# Patient Record
Sex: Male | Born: 1985 | Race: White | Hispanic: No | Marital: Single | Smoking: Current every day smoker
Health system: Southern US, Community
[De-identification: ages and names within clinical notes are randomized; demographics above are authoritative.]

---
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
## 2007-03-01 ENCOUNTER — Emergency Department (HOSPITAL_COMMUNITY): Admission: EM | Admit: 2007-03-01 | Discharge: 2007-03-01 | Payer: Self-pay | Admitting: Emergency Medicine

## 2009-10-18 ENCOUNTER — Emergency Department (HOSPITAL_COMMUNITY): Admission: EM | Admit: 2009-10-18 | Discharge: 2009-10-18 | Payer: Self-pay | Admitting: Family Medicine

## 2009-10-18 ENCOUNTER — Ambulatory Visit (HOSPITAL_COMMUNITY): Admission: RE | Admit: 2009-10-18 | Discharge: 2009-10-18 | Payer: Self-pay | Admitting: Family Medicine

## 2011-03-11 LAB — DIFFERENTIAL
Basophils Absolute: 0.1
Eosinophils Absolute: 0.3
Eosinophils Relative: 3
Lymphocytes Relative: 22
Lymphs Abs: 2.3
Monocytes Absolute: 0.6
Neutro Abs: 6.8

## 2011-03-11 LAB — POCT I-STAT CREATININE
Creatinine, Ser: 1.1
Operator id: 285841

## 2011-03-11 LAB — I-STAT 8, (EC8 V) (CONVERTED LAB)
Acid-Base Excess: 1
Glucose, Bld: 96
TCO2: 29
pCO2, Ven: 49.4
pH, Ven: 7.355 — ABNORMAL HIGH

## 2011-03-11 LAB — CBC
Hemoglobin: 15.1
MCHC: 34.1
MCV: 90.2
RBC: 4.91
WBC: 10.1

## 2011-03-11 LAB — POCT CARDIAC MARKERS
CKMB, poc: 1.4
Myoglobin, poc: 59.9

## 2012-10-17 ENCOUNTER — Emergency Department (INDEPENDENT_AMBULATORY_CARE_PROVIDER_SITE_OTHER)
Admission: EM | Admit: 2012-10-17 | Discharge: 2012-10-17 | Disposition: A | Discharge status: Home / Self Care | Payer: Self-pay | Source: Home / Self Care | Attending: Family Medicine | Admitting: Family Medicine

## 2012-10-17 ENCOUNTER — Encounter (HOSPITAL_COMMUNITY): Payer: Self-pay | Admitting: *Deleted

## 2012-10-17 DIAGNOSIS — S40919A Unspecified superficial injury of unspecified shoulder, initial encounter: Secondary | ICD-10-CM

## 2012-10-17 MED ORDER — DICLOFENAC POTASSIUM 50 MG PO TABS: 50.0000 mg | ORAL_TABLET | Freq: Three times a day (TID) | ORAL | Status: DC
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
## 2013-05-03 ENCOUNTER — Emergency Department (HOSPITAL_COMMUNITY): Payer: Self-pay

## 2013-05-03 ENCOUNTER — Encounter (HOSPITAL_COMMUNITY): Payer: Self-pay | Admitting: Emergency Medicine

## 2013-05-03 ENCOUNTER — Emergency Department (HOSPITAL_COMMUNITY)
Admission: EM | Admit: 2013-05-03 | Discharge: 2013-05-03 | Disposition: A | Discharge status: Home / Self Care | Payer: Self-pay | Attending: Emergency Medicine | Admitting: Emergency Medicine

## 2013-05-03 DIAGNOSIS — IMO0002 Reserved for concepts with insufficient information to code with codable children: Secondary | ICD-10-CM | POA: Insufficient documentation

## 2013-05-03 DIAGNOSIS — S62173A Displaced fracture of trapezium [larger multangular], unspecified wrist, initial encounter for closed fracture: Secondary | ICD-10-CM | POA: Insufficient documentation

## 2013-05-03 DIAGNOSIS — Y990 Civilian activity done for income or pay: Secondary | ICD-10-CM | POA: Insufficient documentation

## 2013-05-03 DIAGNOSIS — W230XXA Caught, crushed, jammed, or pinched between moving objects, initial encounter: Secondary | ICD-10-CM | POA: Insufficient documentation

## 2013-05-03 DIAGNOSIS — S62171A Displaced fracture of trapezium [larger multangular], right wrist, initial encounter for closed fracture: Secondary | ICD-10-CM

## 2013-05-03 DIAGNOSIS — Y9389 Activity, other specified: Secondary | ICD-10-CM | POA: Insufficient documentation

## 2013-05-03 DIAGNOSIS — Y9269 Other specified industrial and construction area as the place of occurrence of the external cause: Secondary | ICD-10-CM | POA: Insufficient documentation

## 2013-05-03 DIAGNOSIS — S60511A Abrasion of right hand, initial encounter: Secondary | ICD-10-CM

## 2013-05-03 DIAGNOSIS — F17200 Nicotine dependence, unspecified, uncomplicated: Secondary | ICD-10-CM | POA: Insufficient documentation

## 2013-05-03 MED ORDER — OXYCODONE-ACETAMINOPHEN 5-325 MG PO TABS
1.0000 | ORAL_TABLET | Freq: Once | ORAL | Status: AC
  Administered 2013-05-03: 1 via ORAL
  Filled 2013-05-03: qty 1

## 2013-05-03 MED ORDER — OXYCODONE-ACETAMINOPHEN 5-325 MG PO TABS: 1.0000 | ORAL_TABLET | ORAL | Status: DC | PRN

## 2013-05-03 NOTE — Progress Notes (Signed)
Orthopedic Tech Progress Note Patient Details:  Jeff Maldonado 03/04/1986 [id number]  Ortho Devices Type of Ortho Device: Thumb spica splint   [name] 05/03/2013, 5:45 AM

## 2013-05-03 NOTE — ED Notes (Signed)
Clicked off on order by mistake- ortho tech needed new order to "click".  Unable to "unclick".

## 2013-05-03 NOTE — ED Notes (Signed)
Still waiting for ortho tech.

## 2013-05-03 NOTE — ED Notes (Signed)
Ortho tech at bedside to complete task.

## 2013-05-03 NOTE — ED Notes (Signed)
Patient states he hurt his hand/wrist on a building door.  Unclear if door shut on or if patient hit the door.   Patient complains of pain in wrist and hand.

## 2013-05-03 NOTE — Progress Notes (Signed)
Orthopedic Tech Progress Note Patient Details:  Jeff Maldonado 06/26/1985 [id number]  Ortho Devices Type of Ortho Device: Thumb spica splint   [name] M 05/03/2013, 5:45 AM  
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
## 2013-11-19 ENCOUNTER — Emergency Department (HOSPITAL_COMMUNITY)
Admission: EM | Admit: 2013-11-19 | Discharge: 2013-11-19 | Disposition: A | Discharge status: Home / Self Care | Payer: 59 | Attending: Emergency Medicine | Admitting: Emergency Medicine

## 2013-11-19 ENCOUNTER — Encounter (HOSPITAL_COMMUNITY): Payer: Self-pay | Admitting: Emergency Medicine

## 2013-11-19 DIAGNOSIS — M545 Low back pain, unspecified: Secondary | ICD-10-CM | POA: Insufficient documentation

## 2013-11-19 DIAGNOSIS — Z202 Contact with and (suspected) exposure to infections with a predominantly sexual mode of transmission: Secondary | ICD-10-CM | POA: Insufficient documentation

## 2013-11-19 DIAGNOSIS — G4700 Insomnia, unspecified: Secondary | ICD-10-CM | POA: Insufficient documentation

## 2013-11-19 DIAGNOSIS — A088 Other specified intestinal infections: Secondary | ICD-10-CM | POA: Insufficient documentation

## 2013-11-19 DIAGNOSIS — F17200 Nicotine dependence, unspecified, uncomplicated: Secondary | ICD-10-CM | POA: Insufficient documentation

## 2013-11-19 DIAGNOSIS — A084 Viral intestinal infection, unspecified: Secondary | ICD-10-CM

## 2013-11-19 LAB — COMPREHENSIVE METABOLIC PANEL
ALBUMIN: 4.1 g/dL (ref 3.5–5.2)
ALT: 13 U/L (ref 0–53)
AST: 15 U/L (ref 0–37)
Alkaline Phosphatase: 51 U/L (ref 39–117)
BUN: 10 mg/dL (ref 6–23)
CO2: 27 mEq/L (ref 19–32)
CREATININE: 0.91 mg/dL (ref 0.50–1.35)
Calcium: 9.3 mg/dL (ref 8.4–10.5)
Chloride: 101 mEq/L (ref 96–112)
GFR calc Af Amer: >90 mL/min (ref >90)
GFR calc non Af Amer: >90 mL/min (ref >90)
Glucose, Bld: 147 mg/dL — ABNORMAL HIGH (ref 70–99)
POTASSIUM: 3.7 meq/L (ref 3.7–5.3)
Sodium: 142 mEq/L (ref 137–147)
TOTAL PROTEIN: 7.2 g/dL (ref 6.0–8.3)
Total Bilirubin: 0.7 mg/dL (ref 0.3–1.2)

## 2013-11-19 LAB — URINALYSIS, ROUTINE W REFLEX MICROSCOPIC
Bilirubin Urine: NEGATIVE
GLUCOSE, UA: 100 mg/dL — AB
Hgb urine dipstick: NEGATIVE
Ketones, ur: NEGATIVE mg/dL
LEUKOCYTES UA: NEGATIVE
Nitrite: NEGATIVE
PH: 6 (ref 5.0–8.0)
Protein, ur: NEGATIVE mg/dL
Specific Gravity, Urine: 1.028 (ref 1.005–1.030)
Urobilinogen, UA: 0.2 mg/dL (ref 0.0–1.0)

## 2013-11-19 LAB — CBC WITH DIFFERENTIAL/PLATELET
BASOS PCT: 1 % (ref 0–1)
Basophils Absolute: 0.1 10*3/uL (ref 0.0–0.1)
EOS ABS: 0.2 10*3/uL (ref 0.0–0.7)
Eosinophils Relative: 2 % (ref 0–5)
HCT: 39.9 % (ref 39.0–52.0)
HEMOGLOBIN: 13.9 g/dL (ref 13.0–17.0)
Lymphocytes Relative: 22 % (ref 12–46)
Lymphs Abs: 2.4 10*3/uL (ref 0.7–4.0)
MCH: 31 pg (ref 26.0–34.0)
MCHC: 34.8 g/dL (ref 30.0–36.0)
MCV: 88.9 fL (ref 78.0–100.0)
MONO ABS: 0.8 10*3/uL (ref 0.1–1.0)
MONOS PCT: 7 % (ref 3–12)
NEUTROS PCT: 68 % (ref 43–77)
Neutro Abs: 7.2 10*3/uL (ref 1.7–7.7)
Platelets: 272 10*3/uL (ref 150–400)
RBC: 4.49 MIL/uL (ref 4.22–5.81)
RDW: 12 % (ref 11.5–15.5)
WBC: 10.6 10*3/uL — ABNORMAL HIGH (ref 4.0–10.5)

## 2013-11-19 MED ORDER — AZITHROMYCIN 1 G PO PACK
1.0000 g | PACK | Freq: Once | ORAL | Status: AC
  Administered 2013-11-19: 1 g via ORAL
  Filled 2013-11-19: qty 1

## 2013-11-19 MED ORDER — CEFTRIAXONE SODIUM 250 MG IJ SOLR
250.0000 mg | Freq: Once | INTRAMUSCULAR | Status: AC
  Administered 2013-11-19: 250 mg via INTRAMUSCULAR
  Filled 2013-11-19: qty 250

## 2013-11-19 MED ORDER — METRONIDAZOLE 500 MG PO TABS
2000.0000 mg | ORAL_TABLET | Freq: Once | ORAL | Status: AC
  Administered 2013-11-19: 2000 mg via ORAL
  Filled 2013-11-19: qty 4

## 2013-11-19 MED ORDER — ONDANSETRON HCL 4 MG PO TABS: 4.0000 mg | ORAL_TABLET | Freq: Four times a day (QID) | ORAL | Status: DC

## 2013-11-19 NOTE — ED Notes (Signed)
Pt getting a urine specimen at this time. States he has been having chills for the past week and just feels weak all over. States no foul odor with urine only with bowels.

## 2013-11-19 NOTE — ED Provider Notes (Signed)
CSN: [id number]     Arrival date & time 11/19/13  1206 History   First MD Initiated Contact with Patient 11/19/13 1709     Chief Complaint  Patient presents with  . Weakness  . Emesis     (Consider location/radiation/quality/duration/timing/severity/associated sxs/prior Treatment) HPI Comments: Pt presents w/ about 1 week of n/v/d, intermittent, sharp, BL low back pains, chills, generalized weakness and insomnia. Pt reports his sexual partner was recently treated for trich. He denies urinary frequency, dysuria, hematuria, penile d/c, rectal pain. No back pain currently.  Patient is a 28 y.o. male presenting with weakness and vomiting. The history is provided by the patient. No language interpreter was used.  Weakness This is a new problem. The current episode started more than 2 days ago (1 week). The problem occurs constantly. The problem has been gradually worsening. Pertinent negatives include no chest pain, no abdominal pain, no headaches and no shortness of breath. Nothing aggravates the symptoms. Nothing relieves the symptoms. He has tried nothing for the symptoms. The treatment provided no relief.  Emesis Severity:  Moderate Duration:  1 week Timing:  Intermittent Number of daily episodes:  2-5 Quality:  Stomach contents Progression:  Unchanged Chronicity:  New Recent urination:  Normal Relieved by:  Nothing Worsened by:  Nothing tried Ineffective treatments:  None tried Associated symptoms: chills and diarrhea   Associated symptoms: no abdominal pain, no cough and no headaches   Diarrhea:    Quality:  Watery and semi-solid   Number of occurrences:  3-5 daily   Severity:  Moderate   Duration:  1 month   Timing:  Intermittent   Progression:  Unchanged Risk factors: no sick contacts, no suspect food intake and no travel to endemic areas     History reviewed. No pertinent past medical history. History reviewed. No pertinent past surgical history. Family History  Problem  Relation Age of Onset  . Diabetes Maternal Aunt    History  Substance Use Topics  . Smoking status: Current Every Day Smoker -- 1.00 packs/day    Types: Cigarettes  . Smokeless tobacco: Not on file  . Alcohol Use: No    Review of Systems  Constitutional: Positive for chills. Negative for fever, activity change, appetite change and fatigue.  HENT: Negative for congestion, facial swelling, rhinorrhea and trouble swallowing.   Eyes: Negative for photophobia and pain.  Respiratory: Negative for cough, chest tightness and shortness of breath.   Cardiovascular: Negative for chest pain and leg swelling.  Gastrointestinal: Positive for vomiting and diarrhea. Negative for nausea, abdominal pain and constipation.  Endocrine: Negative for polydipsia and polyuria.  Genitourinary: Negative for dysuria, urgency, decreased urine volume and difficulty urinating.  Musculoskeletal: Negative for back pain and gait problem.  Skin: Negative for color change, rash and wound.  Allergic/Immunologic: Negative for immunocompromised state.  Neurological: Positive for weakness. Negative for dizziness, facial asymmetry, speech difficulty, numbness and headaches.  Psychiatric/Behavioral: Negative for confusion, decreased concentration and agitation.      Allergies  Review of patient's allergies indicates no known allergies.  Home Medications   Prior to Admission medications   Medication Sig Start Date End Date Taking? Authorizing Provider  Aspirin-Acetaminophen-Caffeine (GOODY HEADACHE PO) Take 2 packets by mouth daily as needed (pain).   Yes Historical Provider, MD  ondansetron (ZOFRAN) 4 MG tablet Take 1 tablet (4 mg total) by mouth every 6 (six) hours. 11/19/13   Shanna CiscoMegan E Docherty, MD   BP 120/73  Pulse 58  Temp(Src) 98.9 F (37.2  C) (Oral)  Resp 13  SpO2 98% Physical Exam  Constitutional: He is oriented to person, place, and time. He appears well-developed and well-nourished. No distress.  HENT:   Head: Normocephalic and atraumatic.  Mouth/Throat: No oropharyngeal exudate.  Eyes: Pupils are equal, round, and reactive to light.  Neck: Normal range of motion. Neck supple.  Cardiovascular: Normal rate, regular rhythm and normal heart sounds.  Exam reveals no gallop and no friction rub.   No murmur heard. Pulmonary/Chest: Effort normal and breath sounds normal. No respiratory distress. He has no wheezes. He has no rales.  Abdominal: Soft. Bowel sounds are normal. He exhibits no distension and no mass. There is no tenderness. There is no rebound and no guarding.  Genitourinary: Penis normal.  Musculoskeletal: Normal range of motion. He exhibits no edema and no tenderness.  Neurological: He is alert and oriented to person, place, and time.  Skin: Skin is warm and dry.  Psychiatric: He has a normal mood and affect.    ED Course  Procedures (including critical care time) Labs Review Labs Reviewed  CBC WITH DIFFERENTIAL - Abnormal; Notable for the following:    WBC 10.6 (*)    All other components within normal limits  COMPREHENSIVE METABOLIC PANEL - Abnormal; Notable for the following:    Glucose, Bld 147 (*)    All other components within normal limits  URINALYSIS, ROUTINE W REFLEX MICROSCOPIC - Abnormal; Notable for the following:    Glucose, UA 100 (*)    All other components within normal limits  GC/CHLAMYDIA PROBE AMP  RPR    Imaging Review No results found.   EKG Interpretation None      MDM   Final diagnoses:  Viral gastroenteritis  Exposure to STD    Pt is a 28 y.o. male with Pmhx as above who presents with about 1 week of n/v/d, intermittent, sharp, BL low back pains, chills, and insomnia. Pt reports his sexual partner was recently treated for trich. He denies urinary frequency, dysuria, hematuria, penile d/c, rectal pain. No back pain currently. On PE, VSS, pt in NAD. He has mild LUQ ab pain on PE w/o rebound or guarding. GU exam benign. No d/c. WBC mildly  elevated. CMP noncontributory, UA nml. GC/Chlam, RPR sent. Pt declined. HIV.  Suspect viral gastroenteritis. Will treat for GC/CHlam/trich exposure. Return precautions given for new or worsening symptoms including worsening pain, fever, inability to tolerate PO.          Shanna CiscoMegan E Docherty, MD 11/19/13 [phone]

## 2013-11-19 NOTE — ED Notes (Signed)
Pt. Stated, I need to talk to a doctor.  I've been having weakness with vomiting for about a week now.  Me and my wife got back together a  month ago and then she found out she has Angolarich.. So Im not sure .  I know I have this foul smell in my bowels and my nerves are really bad.

## 2013-11-19 NOTE — ED Notes (Signed)
Inspected right arm, no swelling, redness noted.

## 2013-11-19 NOTE — Discharge Instructions (Signed)
Viral Gastroenteritis °Viral gastroenteritis is also known as stomach flu. This condition affects the stomach and intestinal tract. It can cause sudden diarrhea and vomiting. The illness typically lasts 3 to 8 days. Most people develop an immune response that eventually gets rid of the virus. While this natural response develops, the virus can make you quite ill. °CAUSES  °Many different viruses can cause gastroenteritis, such as rotavirus or noroviruses. You can catch one of these viruses by consuming contaminated food or water. You may also catch a virus by sharing utensils or other personal items with an infected person or by touching a contaminated surface. °SYMPTOMS  °The most common symptoms are diarrhea and vomiting. These problems can cause a severe loss of body fluids (dehydration) and a body salt (electrolyte) imbalance. Other symptoms may include: °· Fever. °· Headache. °· Fatigue. °· Abdominal pain. °DIAGNOSIS  °Your caregiver can usually diagnose viral gastroenteritis based on your symptoms and a physical exam. A stool sample may also be taken to test for the presence of viruses or other infections. °TREATMENT  °This illness typically goes away on its own. Treatments are aimed at rehydration. The most serious cases of viral gastroenteritis involve vomiting so severely that you are not able to keep fluids down. In these cases, fluids must be given through an intravenous line (IV). °HOME CARE INSTRUCTIONS  °· Drink enough fluids to keep your urine clear or pale yellow. Drink small amounts of fluids frequently and increase the amounts as tolerated. °· Ask your caregiver for specific rehydration instructions. °· Avoid: °¨ Foods high in sugar. °¨ Alcohol. °¨ Carbonated drinks. °¨ Tobacco. °¨ Juice. °¨ Caffeine drinks. °¨ Extremely hot or cold fluids. °¨ Fatty, greasy foods. °¨ Too much intake of anything at one time. °¨ Dairy products until 24 to 48 hours after diarrhea stops. °· You may consume probiotics.  Probiotics are active cultures of beneficial bacteria. They may lessen the amount and number of diarrheal stools in adults. Probiotics can be found in yogurt with active cultures and in supplements. °· Wash your hands well to avoid spreading the virus. °· Only take over-the-counter or prescription medicines for pain, discomfort, or fever as directed by your caregiver. Do not give aspirin to children. Antidiarrheal medicines are not recommended. °· Ask your caregiver if you should continue to take your regular prescribed and over-the-counter medicines. °· Keep all follow-up appointments as directed by your caregiver. °SEEK IMMEDIATE MEDICAL CARE IF:  °· You are unable to keep fluids down. °· You do not urinate at least once every 6 to 8 hours. °· You develop shortness of breath. °· You notice blood in your stool or vomit. This may look like coffee grounds. °· You have abdominal pain that increases or is concentrated in one small area (localized). °· You have persistent vomiting or diarrhea. °· You have a fever. °· The patient is a child younger than 3 months, and he or she has a fever. °· The patient is a child older than 3 months, and he or she has a fever and persistent symptoms. °· The patient is a child older than 3 months, and he or she has a fever and symptoms suddenly get worse. °· The patient is a baby, and he or she has no tears when crying. °MAKE SURE YOU:  °· Understand these instructions. °· Will watch your condition. °· Will get help right away if you are not doing well or get worse. °Document Released: 05/17/2005 Document Revised: 08/09/2011 Document Reviewed: 03/03/2011 °  ExitCare Patient Information 2015 [place], [place]. This information is not intended to replace advice given to you by your health care provider. Make sure you discuss any questions you have with your health care provider.  Sexually Transmitted Disease A sexually transmitted disease (STD) is a disease or infection often passed to  another person during sex. However, STDs can be passed through nonsexual ways. An STD can be passed through:  Spit (saliva).  Semen.  Blood.  Mucus from the vagina.  Pee (urine). HOW CAN I LESSEN MY CHANCES OF GETTING AN STD?  Use:  Latex condoms.  Water-soluble lubricants with condoms. Do not use petroleum jelly or oils.  Dental dams. These are small pieces of latex that are used as a barrier during oral sex.  Avoid having more than one sex partner.  Do not have sex with someone who has other sex partners.  Do not have sex with anyone you do not know or who is at high risk for an STD.  Avoid risky sex that can break your skin.  Do not have sex if you have open sores on your mouth or skin.  Avoid drinking too much alcohol or taking illegal drugs. Alcohol and drugs can affect your good judgment.  Avoid oral and anal sex acts.  Get shots (vaccines) for HPV and hepatitis.  If you are at risk of being infected with HIV, it is advised that you take a certain medicine daily to prevent HIV infection. This is called pre-exposure prophylaxis (PrEP). You may be at risk if:  You are a man who has sex with other men (MSM).  You are attracted to the opposite sex (heterosexual) and are having sex with more than one partner.  You take drugs with a needle.  You have sex with someone who has HIV.  Talk with your doctor about if you are at high risk of being infected with HIV. If you begin to take PrEP, get tested for HIV first. Get tested every 3 months for as long as you are taking PrEP. WHAT SHOULD I DO IF I THINK I HAVE AN STD?  See your doctor.  Tell your sex partner(s) that you have an STD. They should be tested and treated.  Do not have sex until your doctor says it is okay. WHEN SHOULD I GET HELP? Get help right away if:  You have bad belly (abdominal) pain.  You are a man and have puffiness (swelling) or pain in your testicles.  You are a woman and have puffiness  in your vagina. Document Released: 06/24/2004 Document Revised: 05/22/2013 Document Reviewed: 11/10/2012 [facility] Patient Information 2015 [place], [place]. This information is not intended to replace advice given to you by your health care provider. Make sure you discuss any questions you have with your health care provider.

## 2013-11-20 LAB — GC/CHLAMYDIA PROBE AMP
CT Probe RNA: NEGATIVE
GC PROBE AMP APTIMA: NEGATIVE

## 2013-11-20 LAB — RPR

## 2016-07-08 ENCOUNTER — Encounter (HOSPITAL_COMMUNITY): Payer: Self-pay

## 2016-07-08 DIAGNOSIS — Z7982 Long term (current) use of aspirin: Secondary | ICD-10-CM | POA: Insufficient documentation

## 2016-07-08 DIAGNOSIS — F17210 Nicotine dependence, cigarettes, uncomplicated: Secondary | ICD-10-CM | POA: Insufficient documentation

## 2016-07-08 DIAGNOSIS — Z79899 Other long term (current) drug therapy: Secondary | ICD-10-CM | POA: Insufficient documentation

## 2016-07-08 DIAGNOSIS — F1910 Other psychoactive substance abuse, uncomplicated: Secondary | ICD-10-CM | POA: Insufficient documentation

## 2016-07-08 LAB — CBC
HEMATOCRIT: 37.1 % — AB (ref 39.0–52.0)
HEMOGLOBIN: 12.7 g/dL — AB (ref 13.0–17.0)
MCH: 30.4 pg (ref 26.0–34.0)
MCHC: 34.2 g/dL (ref 30.0–36.0)
MCV: 88.8 fL (ref 78.0–100.0)
Platelets: 313 10*3/uL (ref 150–400)
RBC: 4.18 MIL/uL — ABNORMAL LOW (ref 4.22–5.81)
RDW: 12.7 % (ref 11.5–15.5)
WBC: 9.8 10*3/uL (ref 4.0–10.5)

## 2016-07-08 LAB — COMPREHENSIVE METABOLIC PANEL
ALT: 26 U/L (ref 17–63)
AST: 31 U/L (ref 15–41)
Albumin: 3.9 g/dL (ref 3.5–5.0)
Alkaline Phosphatase: 59 U/L (ref 38–126)
Anion gap: 8 (ref 5–15)
BILIRUBIN TOTAL: 0.7 mg/dL (ref 0.3–1.2)
BUN: 11 mg/dL (ref 6–20)
CHLORIDE: 106 mmol/L (ref 101–111)
CO2: 26 mmol/L (ref 22–32)
CREATININE: 1.05 mg/dL (ref 0.61–1.24)
Calcium: 9.1 mg/dL (ref 8.9–10.3)
Glucose, Bld: 109 mg/dL — ABNORMAL HIGH (ref 65–99)
POTASSIUM: 3.7 mmol/L (ref 3.5–5.1)
Sodium: 140 mmol/L (ref 135–145)
Total Protein: 6.4 g/dL — ABNORMAL LOW (ref 6.5–8.1)

## 2016-07-08 LAB — RAPID URINE DRUG SCREEN, HOSP PERFORMED
Amphetamines: POSITIVE — AB
BARBITURATES: NOT DETECTED
BENZODIAZEPINES: NOT DETECTED
COCAINE: NOT DETECTED
OPIATES: POSITIVE — AB
Tetrahydrocannabinol: NOT DETECTED

## 2016-07-08 LAB — ETHANOL

## 2016-07-08 NOTE — ED Triage Notes (Signed)
Pt brought in by [employer]. Pt IVC'd at triage. Per IVC pt on amphetamines, hallucinating. Pt denies any hallucinations. Pt denies any drugs/ETOH tonight.

## 2016-07-09 ENCOUNTER — Emergency Department (HOSPITAL_COMMUNITY)
Admission: EM | Admit: 2016-07-09 | Discharge: 2016-07-09 | Disposition: A | Discharge status: Home / Self Care | Payer: Self-pay | Attending: Emergency Medicine | Admitting: Emergency Medicine

## 2016-07-09 DIAGNOSIS — F1910 Other psychoactive substance abuse, uncomplicated: Secondary | ICD-10-CM

## 2016-07-09 NOTE — ED Notes (Signed)
TTS outside door

## 2016-07-09 NOTE — ED Notes (Signed)
Step mother:  Jeff Maldonado requesting to speak with counselor after pt's interview. [phone].

## 2016-07-09 NOTE — ED Notes (Signed)
Mal[place]urkey sandwich and Coke given.

## 2016-07-09 NOTE — ED Notes (Signed)
Lunch order placed

## 2016-07-09 NOTE — ED Notes (Addendum)
TTS machine not working, Financial traderassessors are aware. Assessor will come here to assess.

## 2016-07-09 NOTE — ED Provider Notes (Signed)
Patient signed out to me at 0730 awaiting tts. 11:01 AM Awaiting tts report 12:05 PM Awaiting tts assessment Discussed with Dr. Adela LankFloyd and signed out to him pending tts   Margarita Grizzleanielle Porche Steinberger, MD 07/09/16 [phone]

## 2016-07-09 NOTE — ED Notes (Signed)
Lunch meal given. 

## 2016-07-09 NOTE — ED Notes (Addendum)
Upon return of belongings pt reports he is missing a black wallet on a chain. No documentation or inventory of wallet found. No wallet present in security office. [place] [facility] [place] [facility] [place] spoke to BlueLinxfficer Chandler who brought pt to the ED last night. Per Officer Ave Filterhandler, upon searching the pt last night no wallet was found on pt's person. Family and pt updated.

## 2016-07-09 NOTE — ED Notes (Signed)
TTS at bedside. 

## 2016-07-09 NOTE — ED Notes (Signed)
Contacted TTS regarding delay in assessment. Primary RN updated.

## 2016-07-09 NOTE — ED Provider Notes (Signed)
[facility]EM[facility] DEPT Provider Note   CSN: [id number] Arrival date & time: 07/08/16  1944  By signing my name below, I, Arianna Nassar, attest that this documentation has been prepared under the direction and in the presence of Geoffery Lyons, MD.  Electronically Signed: Octavia Heir, ED Scribe. 07/09/16. 1:53 AM.    History   Chief Complaint Chief Complaint  Patient presents with  . Medical Clearance   The history is provided by the patient and the police. No language interpreter was used.   HPI Comments: Jeff Maldonado is a 31 y.o. male who presents to the Emergency Department by [employer] presenting for medical clearance. Pt says his mother called [employer] to have him involuntarily committed because "she thinks I have a problem". Pt denies SI, HI, and visual or auditory hallucinations.   History is limited. Pt reluctant to answer questions.   History reviewed. No pertinent past medical history.  There are no active problems to display for this patient.   History reviewed. No pertinent surgical history.     Home Medications    Prior to Admission medications   Medication Sig Start Date End Date Taking? Authorizing Provider  Aspirin-Acetaminophen-Caffeine (GOODY HEADACHE PO) Take 2 packets by mouth daily as needed (pain).    Historical Provider, MD  ondansetron (ZOFRAN) 4 MG tablet Take 1 tablet (4 mg total) by mouth every 6 (six) hours. 11/19/13   Toy Cookey, MD    Family History Family History  Problem Relation Age of Onset  . Diabetes Maternal Aunt     Social History Social History  Substance Use Topics  . Smoking status: Current Every Day Smoker    Packs/day: 1.00    Types: Cigarettes  . Smokeless tobacco: Never Used  . Alcohol use No     Allergies   Patient has no known allergies.   Review of Systems Review of Systems  Psychiatric/Behavioral: Negative for hallucinations and suicidal ideas.  All other systems reviewed and are negative.    Physical  Exam Updated Vital Signs BP 116/76   Pulse 65   Temp 98.7 F (37.1 C) (Oral)   Resp 16   SpO2 100%   Physical Exam  Constitutional: He is oriented to person, place, and time. He appears well-developed and well-nourished.  HENT:  Head: Normocephalic and atraumatic.  Eyes: EOM are normal.  Neck: Normal range of motion.  Cardiovascular: Normal rate, regular rhythm, normal heart sounds and intact distal pulses.   Pulmonary/Chest: Effort normal and breath sounds normal. No respiratory distress.  Abdominal: Soft. He exhibits no distension. There is no tenderness.  Musculoskeletal: Normal range of motion.  Neurological: He is alert and oriented to person, place, and time.  Skin: Skin is warm and dry.  Psychiatric: He has a normal mood and affect. Judgment normal. His speech is delayed. He is withdrawn. Cognition and memory are normal. He expresses no homicidal and no suicidal ideation. He expresses no suicidal plans and no homicidal plans.  Nursing note and vitals reviewed.    ED Treatments / Results  DIAGNOSTIC STUDIES: Oxygen Saturation is 100% on RA, normal by my interpretation.  COORDINATION OF CARE:  1:51 AM Discussed treatment plan with pt at bedside and pt agreed to plan.  Labs (all labs ordered are listed, but only abnormal results are displayed) Labs Reviewed  COMPREHENSIVE METABOLIC PANEL - Abnormal; Notable for the following:       Result Value   Glucose, Bld 109 (*)    Total Protein 6.4 (*)  All other components within normal limits  CBC - Abnormal; Notable for the following:    RBC 4.18 (*)    Hemoglobin 12.7 (*)    HCT 37.1 (*)    All other components within normal limits  RAPID URINE DRUG SCREEN, HOSP PERFORMED - Abnormal; Notable for the following:    Opiates POSITIVE (*)    Amphetamines POSITIVE (*)    All other components within normal limits  ETHANOL    EKG  EKG Interpretation None       Radiology No results found.  Procedures Procedures  (including critical care time)  Medications Ordered in ED Medications - No data to display   Initial Impression / Assessment and Plan / ED Course  I have reviewed the triage vital signs and the nursing notes.  Pertinent labs & imaging results that were available during my care of the patient were reviewed by me and considered in my medical decision making (see chart for details).     Patient brought for evaluation of substance abuse, suicidal behavior under involuntary commitment which was initiated by his mother. He is resting comfortably awaiting TTS consultation.  Final Clinical Impressions(s) / ED Diagnoses   Final diagnoses:  None   I personally performed the services described in this documentation, which was scribed in my presence. The recorded information has been reviewed and is accurate.     New Prescriptions New Prescriptions   No medications on file     Geoffery Lyonsouglas Mirl Hillery, MD 07/09/16 [phone]

## 2016-07-09 NOTE — ED Notes (Addendum)
Per Belenda CruiseKristin, TTS counselor, pt IVC to be rescinded and pt to be d/c. Notice of Commitment Change signed by Dr. Adela LankFloyd and [facility] [facility] [address]. Copy placed in medical records.

## 2016-07-09 NOTE — ED Notes (Signed)
Video conference monitor set up at bedside for TTS consult.

## 2016-07-09 NOTE — [facility] Counselor (Signed)
TTS assessment complete. Per Elta GuadeloupeLaurie Parks NP pt does not meet inpatient criteria and IVC can be recinded. Counselor offered outpatient substance abuse resources and pt refused.   [address], [address]

## 2016-07-09 NOTE — ED Notes (Signed)
Jeff ChyleHeather Stanley pts wife here to visit, visitor given phamplet on visitation guidelines, visitor requesting to provide the pts mother's number for any updates, pts visitor aware that the pt will have to give permission for information to be released, pts mother's name per visitor is Serena Colonelngela Hussey [phone]

## 2016-07-09 NOTE — ED Notes (Signed)
Snacks given 

## 2016-07-09 NOTE — ED Notes (Signed)
Dinner order placed 

## 2016-07-09 NOTE — BH Assessment (Signed)
Tele Assessment Note   Jeff Maldonado is an 31 y.o. male who came to the ED under IVC by his mother due to his behavior while he was intoxicated on drugs. He states that he has been using "molly" a little more than he should lately and his mom is concerned about his use. Pt was guarded and did not provide much detail during assessment. He states that he got into an argument with his wife yesterday and decided that he wanted to stay with his Grandma and "get clean" and separate from her for a while. Pt denies SI, HI or AVH and states he has never been inpatient and does not have a psych history. Pt states his sleep and appetite is normal. No history of suicide attempts in the past. No family history of addiction or mental illness reported. Pt denies wanting any help for his substance abuse at this time.   Pt does not meet inpatient criteria per Elta Guadeloupe NP. Pt will need IVC recinded and can be discharged home. EDP made aware of this decision as well as Charity fundraiser.   Diagnosis: Other Hallucinagen use disorder severe   Past Medical History: History reviewed. No pertinent past medical history.  History reviewed. No pertinent surgical history.  Family History:  Family History  Problem Relation Age of Onset  . Diabetes Maternal Aunt     Social History:  reports that he has been smoking Cigarettes.  He has been smoking about 1.00 pack per day. He has never used smokeless tobacco. He reports that he does not drink alcohol or use drugs.  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  General Assessment Data Location of Assessment: [facility] ED TTS Assessment: In system Is this a Tele or Face-to-Face Assessment?: Tele Assessment Is this an Initial Assessment or a Re-assessment for this encounter?: Initial Assessment Marital status: Married Living Arrangements: Spouse/significant other Can pt return to current living arrangement?: Yes Admission Status: Involuntary Is patient capable of signing voluntary admission?: No Referral Source: Self/Family/Friend Insurance type:  (None)     Crisis Care Plan Living Arrangements: Spouse/significant other Name of Psychiatrist: None Name of Therapist: None  Education Status Is patient currently in school?: No  Risk to self with the past 6 months Suicidal Ideation: No Has patient been a risk to self within the past 6 months prior to admission? : No Suicidal Intent: No Has patient had any suicidal intent within the past 6 months prior to admission? : No Is patient at risk for suicide?: No Suicidal Plan?: No Has patient had any suicidal plan within the past 6 months prior to admission? : No Access to Means: No What has been your use of drugs/alcohol within the last 12 months?: using molly frequently Previous Attempts/Gestures: No How many times?: 0 Other Self Harm Risks: sub abuse Triggers for Past Attempts: None known Intentional Self Injurious Behavior:  None Family Suicide History: No Recent stressful life event(s): Conflict (Comment) Persecutory voices/beliefs?: No Depression: No Substance abuse history and/or treatment for substance abuse?: Yes Suicide prevention information given to non-admitted patients: Yes  Risk to Others within the past 6 months Homicidal Ideation: No Does patient have any lifetime risk of violence toward others beyond the six months prior to  admission? : No Thoughts of Harm to Others: No Current Homicidal Intent: No Current Homicidal Plan: No Access to Homicidal Means: No Identified Victim: none History of harm to others?: No Assessment of Violence: None Noted Violent Behavior Description: no Does patient have access to weapons?: No Criminal Charges Pending?: No Does patient have a court date: No Is patient on probation?: No  Psychosis Hallucinations: None noted Delusions: None noted  Mental Status Report Appearance/Hygiene: Unremarkable Eye Contact: Fair Motor Activity: Freedom of movement Speech: Logical/coherent Level of Consciousness: Alert Mood: Depressed Affect: Appropriate to circumstance Anxiety Level: None Thought Processes: Coherent Judgement: Partial Orientation: Person, Place, Time, Situation Obsessive Compulsive Thoughts/Behaviors: None  Cognitive Functioning Concentration: Decreased Memory: Recent Intact, Remote Intact IQ: Average Insight: Fair Impulse Control: Fair Appetite: Fair Weight Loss: 0 Weight Gain: 0 Sleep: Decreased Total Hours of Sleep: 6 Vegetative Symptoms: None  ADLScreening [facility] Assessment Services) Patient's cognitive ability adequate to safely complete daily activities?: Yes Patient able to express need for assistance with ADLs?: Yes Independently performs ADLs?: Yes (appropriate for developmental age)  Prior Inpatient Therapy Prior Inpatient Therapy: No  Prior Outpatient Therapy Prior Outpatient Therapy: No Does patient have an ACCT team?: No Does patient have Intensive In-House Services?  : No Does patient have [facility] services? : No Does patient have P4CC services?: No  ADL Screening (condition at time of admission) Patient's cognitive ability adequate to safely complete daily activities?: Yes Is the patient deaf or have difficulty hearing?: No Does the patient have difficulty seeing, even when wearing glasses/contacts?: No Does the patient have difficulty  concentrating, remembering, or making decisions?: No Patient able to express need for assistance with ADLs?: Yes Does the patient have difficulty dressing or bathing?: No Independently performs ADLs?: Yes (appropriate for developmental age) Does the patient have difficulty walking or climbing stairs?: No Weakness of Legs: None Weakness of Arms/Hands: None  Home Assistive Devices/Equipment Home Assistive Devices/Equipment: None  Therapy Consults (therapy consults require a physician order) PT Evaluation Needed: No OT Evalulation Needed: No SLP Evaluation Needed: No Abuse/Neglect Assessment (Assessment to be complete while patient is alone) Physical Abuse: Denies Verbal Abuse: Denies Sexual Abuse: Denies Exploitation of patient/patient's resources: Denies Self-Neglect: Denies Values / Beliefs Cultural Requests During Hospitalization: None Spiritual Requests During Hospitalization: None Consults Spiritual Care Consult Needed: No Social Work Consult Needed: No Merchant navy officerAdvance Directives (For Healthcare) Does Patient Have a Medical Advance Directive?: No Would patient like information on creating a medical advance directive?: No - Patient declined Nutrition Screen- [facility] Adult/[facility]/AP Patient's home diet: Regular Has the patient recently lost weight without trying?: No Has the patient been eating poorly because of a decreased appetite?: No Malnutrition Screening Tool Score: 0  Additional Information 1:1 In Past 12 Months?: No CIRT Risk: No Elopement Risk: No Does patient have medical clearance?: Yes     Disposition:  Disposition Initial Assessment Completed for this Encounter: Yes Disposition of Patient: Outpatient treatment Type of outpatient treatment: Chemical Dependence - Intensive Outpatient  Kourtlynn Trevor 07/09/2016 5:06 PM

## 2016-10-21 ENCOUNTER — Encounter (HOSPITAL_COMMUNITY): Payer: Self-pay | Admitting: Emergency Medicine

## 2016-10-21 ENCOUNTER — Emergency Department (HOSPITAL_COMMUNITY): Payer: Self-pay

## 2016-10-21 ENCOUNTER — Emergency Department (HOSPITAL_COMMUNITY)
Admission: EM | Admit: 2016-10-21 | Discharge: 2016-10-21 | Disposition: A | Discharge status: Home / Self Care | Payer: Self-pay | Attending: Emergency Medicine | Admitting: Emergency Medicine

## 2016-10-21 DIAGNOSIS — F17210 Nicotine dependence, cigarettes, uncomplicated: Secondary | ICD-10-CM | POA: Insufficient documentation

## 2016-10-21 DIAGNOSIS — M25562 Pain in left knee: Secondary | ICD-10-CM

## 2016-10-21 DIAGNOSIS — Y9289 Other specified places as the place of occurrence of the external cause: Secondary | ICD-10-CM | POA: Insufficient documentation

## 2016-10-21 DIAGNOSIS — X500XXA Overexertion from strenuous movement or load, initial encounter: Secondary | ICD-10-CM | POA: Insufficient documentation

## 2016-10-21 DIAGNOSIS — Z79899 Other long term (current) drug therapy: Secondary | ICD-10-CM | POA: Insufficient documentation

## 2016-10-21 DIAGNOSIS — Y990 Civilian activity done for income or pay: Secondary | ICD-10-CM | POA: Insufficient documentation

## 2016-10-21 DIAGNOSIS — M25462 Effusion, left knee: Secondary | ICD-10-CM | POA: Insufficient documentation

## 2016-10-21 DIAGNOSIS — Y9389 Activity, other specified: Secondary | ICD-10-CM | POA: Insufficient documentation

## 2016-10-21 MED ORDER — ACETAMINOPHEN 325 MG PO TABS
650.0000 mg | ORAL_TABLET | Freq: Once | ORAL | Status: AC
  Administered 2016-10-21: 650 mg via ORAL
  Filled 2016-10-21: qty 2

## 2016-10-21 MED ORDER — IBUPROFEN 200 MG PO TABS
600.0000 mg | ORAL_TABLET | Freq: Once | ORAL | Status: AC
  Administered 2016-10-21: 600 mg via ORAL
  Filled 2016-10-21: qty 1

## 2016-10-21 NOTE — Discharge Instructions (Signed)
Continue taking 600 mg ibuprofen and 650 mg Tylenol every 8 hours for the next 3 days.

## 2016-10-21 NOTE — ED Triage Notes (Signed)
Patient states he was helping with lifting a car onto a rollback yest and felt something "pop" in his left knee. States it hurt however he was able to walk on his leg yest. States during the night he kept waking up with pain from his knee this am pain was much worse.

## 2016-10-21 NOTE — ED Provider Notes (Signed)
[facility] Provider Note   CSN: [id number] Arrival date & time: 10/21/16  0759     History   Chief Complaint Chief Complaint  Patient presents with  . Knee Pain    HPI Jeff Maldonado is a 31 y.o. male.  The history is provided by the patient.  Knee Pain   This is a new problem. Episode onset: yesterday. The problem occurs constantly. The problem has been gradually worsening. The pain is present in the left knee. The quality of the pain is described as dull and sharp. The pain is moderate. Associated symptoms include limited range of motion (2/2 pain). The symptoms are aggravated by standing and activity. The treatment provided no relief. History of extremity trauma: lifting a truck onto a bed lift and twisted his knee while lifting.    History reviewed. No pertinent past medical history.  There are no active problems to display for this patient.   History reviewed. No pertinent surgical history.     Home Medications    Prior to Admission medications   Medication Sig Start Date End Date Taking? Authorizing Provider  ondansetron (ZOFRAN) 4 MG tablet Take 1 tablet (4 mg total) by mouth every 6 (six) hours. Patient not taking: Reported on 07/09/2016 11/19/13   Toy Cookeyocherty, Megan, MD    Family History Family History  Problem Relation Age of Onset  . Diabetes Maternal Aunt     Social History Social History  Substance Use Topics  . Smoking status: Current Every Day Smoker    Packs/day: 1.00    Types: Cigarettes  . Smokeless tobacco: Never Used  . Alcohol use No     Allergies   Patient has no known allergies.   Review of Systems Review of Systems  Constitutional: Negative for fever.  HENT: Negative.   Respiratory: Negative for cough and shortness of breath.   Cardiovascular: Negative for chest pain.  Gastrointestinal: Negative.   Genitourinary: Negative.   All other systems reviewed and are negative.    Physical Exam Updated Vital Signs BP 131/88  (BP Location: Right Arm)   Pulse 83   Temp 98.6 F (37 C) (Oral)   Resp 20   Ht 5\' 11"  (1.803 m)   Wt 79.4 kg (175 lb)   SpO2 98%   BMI 24.41 kg/m   Physical Exam  Constitutional: He appears well-developed and well-nourished.  HENT:  Head: Normocephalic and atraumatic.  Eyes: Conjunctivae and EOM are normal. Pupils are equal, round, and reactive to light.  Neck: Neck supple.  Cardiovascular: Normal rate and regular rhythm.   No murmur heard. Pulmonary/Chest: Effort normal and breath sounds normal. No respiratory distress.  Abdominal: Soft. There is no tenderness. There is no guarding.  Musculoskeletal: He exhibits no edema.  Tenderness to palpation along the medial joint line of left knee. No obvious swelling or swelling behind knee. He does have good strength to knee extension and flexion but with pain. Neurovascularly intact no obvious deformities. Patella does appear in place. Negative posterior and anterior drawer test. Pain with valgus stress  Neurological: He is alert.  Skin: Skin is warm and dry.  Psychiatric: He has a normal mood and affect.  Nursing note and vitals reviewed.    ED Treatments / Results  Labs (all labs ordered are listed, but only abnormal results are displayed) Labs Reviewed - No data to display  EKG  EKG Interpretation None       Radiology Dg Knee Complete 4 Views Left  Result Date:  10/21/2016 CLINICAL DATA:  Injury to left knee.  Heard a pop.  Pain EXAM: LEFT KNEE - COMPLETE 4+ VIEW COMPARISON:  None. FINDINGS: Moderate joint effusion. No acute bony abnormality. Specifically, no fracture, subluxation, or dislocation. Soft tissues are intact. IMPRESSION: Moderate joint effusion.  No acute bony abnormality. Electronically Signed   By: Charlett Nose M.D.   On: 10/21/2016 08:40    Procedures Procedures (including critical care time)  Medications Ordered in ED Medications  ibuprofen (ADVIL,MOTRIN) tablet 600 mg (600 mg Oral Given 10/21/16  0846)  acetaminophen (TYLENOL) tablet 650 mg (650 mg Oral Given 10/21/16 0846)     Initial Impression / Assessment and Plan / ED Course  I have reviewed the triage vital signs and the nursing notes.  Pertinent labs & imaging results that were available during my care of the patient were reviewed by me and considered in my medical decision making (see chart for details).     Patient is a 31 year old male who presents for concerns of left knee pain after hearing a pop in his left knee lifting heavy objects at work yesterday. Patient states this occurred when he was squatting and lifting up and twisting. Further history and exam as above. Exam consistent with possible meniscal injury. X-ray obtained with joint effusion but no obvious bony abnormalities. Negative drawer test but he does have pain with valgus stress. Advised for rice therapy as well as knee brace.  I have reviewed all imaging. Patient stable for discharge home.  I have reviewed all results with the patient. Advised to f/u with ortho within 1 week. Patient agrees to stated plan. All questions answered. Advised to call or return to have any questions, new symptoms, change in symptoms, or symptoms that they do not understand.   Final Clinical Impressions(s) / ED Diagnoses   Final diagnoses:  Acute pain of left knee  Effusion of left knee    New Prescriptions Discharge Medication List as of 10/21/2016  8:54 AM       Marijean Niemann, MD 10/21/16 1625    Alvira Monday, MD 10/22/16 1208

## 2016-10-21 NOTE — ED Notes (Signed)
Patient transported to X-ray 

## 2018-02-04 IMAGING — DX DG KNEE COMPLETE 4+V*L*
4 series · 4 of 4 positions shown · non-contrast
Comparison: None.

CLINICAL DATA: Injury to left knee.  Heard a pop.  Pain

EXAM:
LEFT KNEE - COMPLETE 4+ VIEW

[t knee ap left]
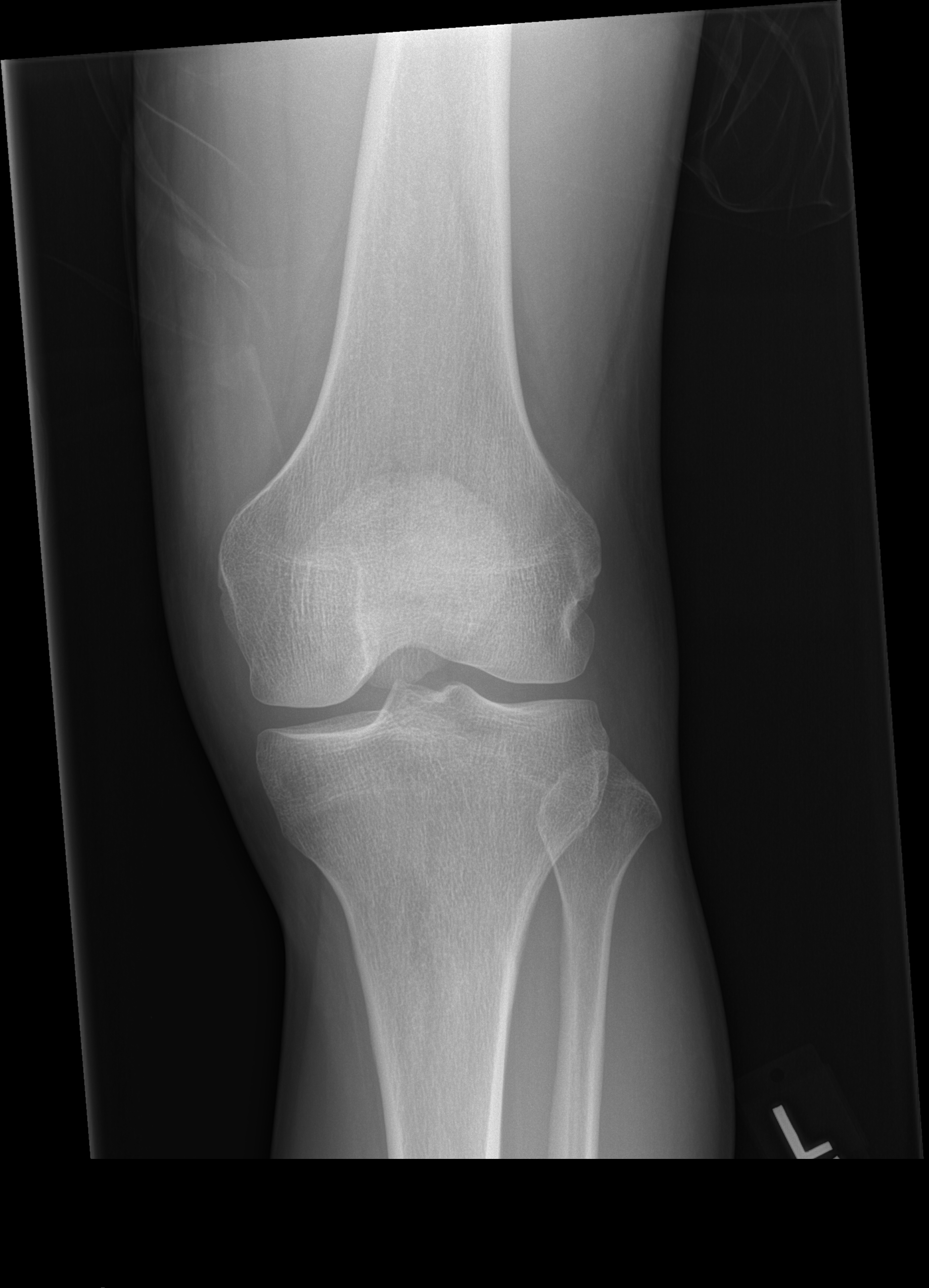

[t knee obl left (1 of 2)]
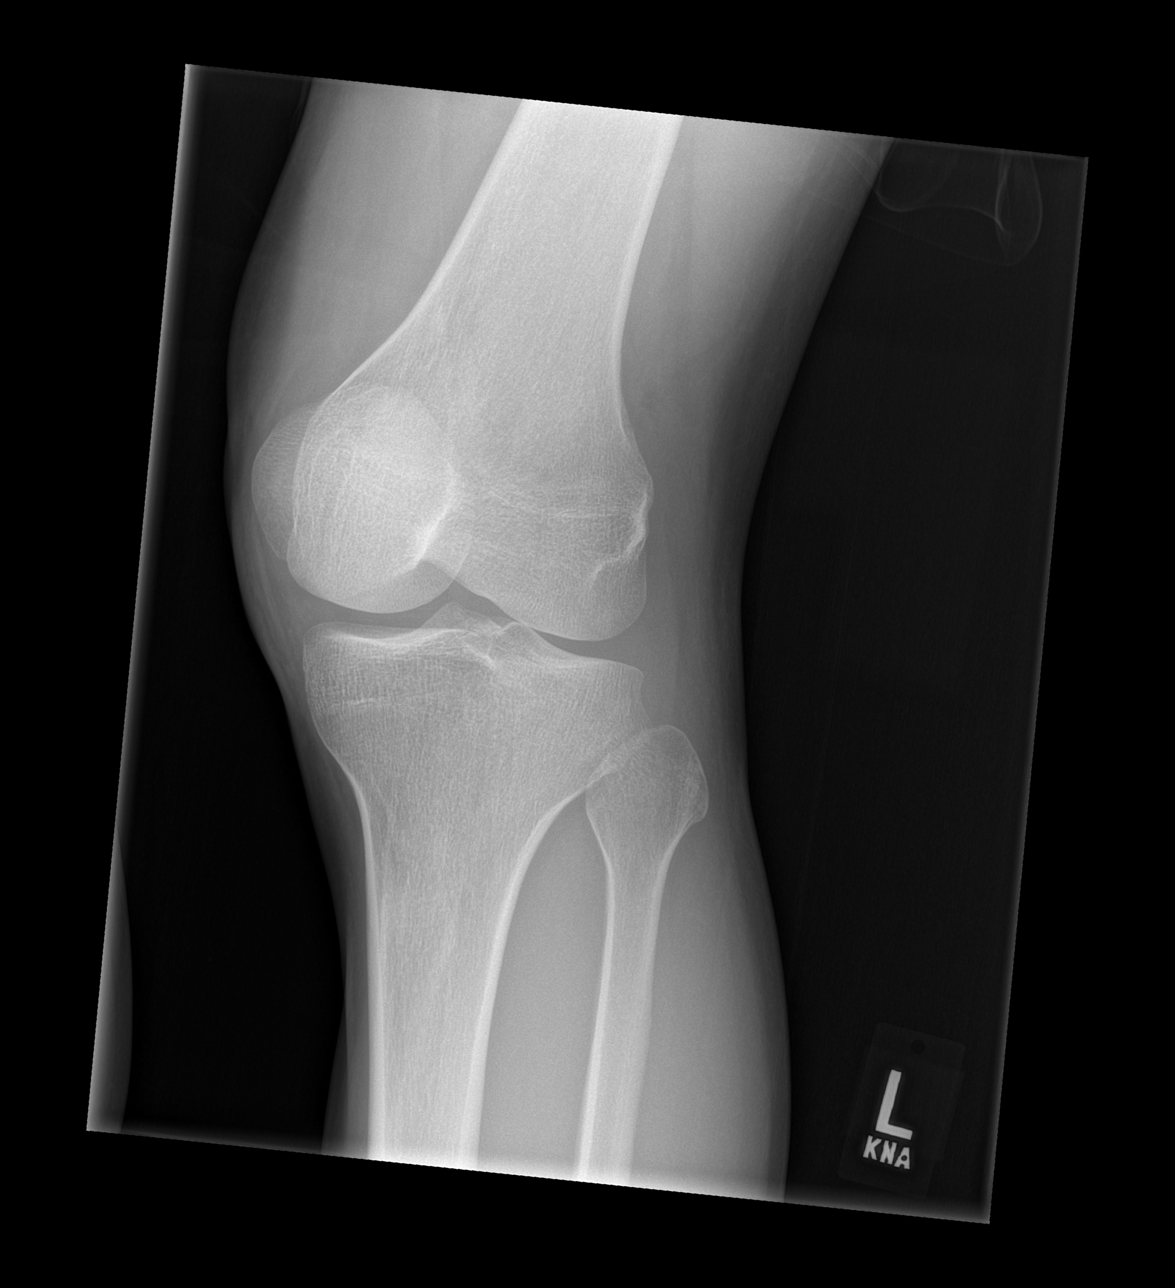

[t knee obl left (2 of 2)]
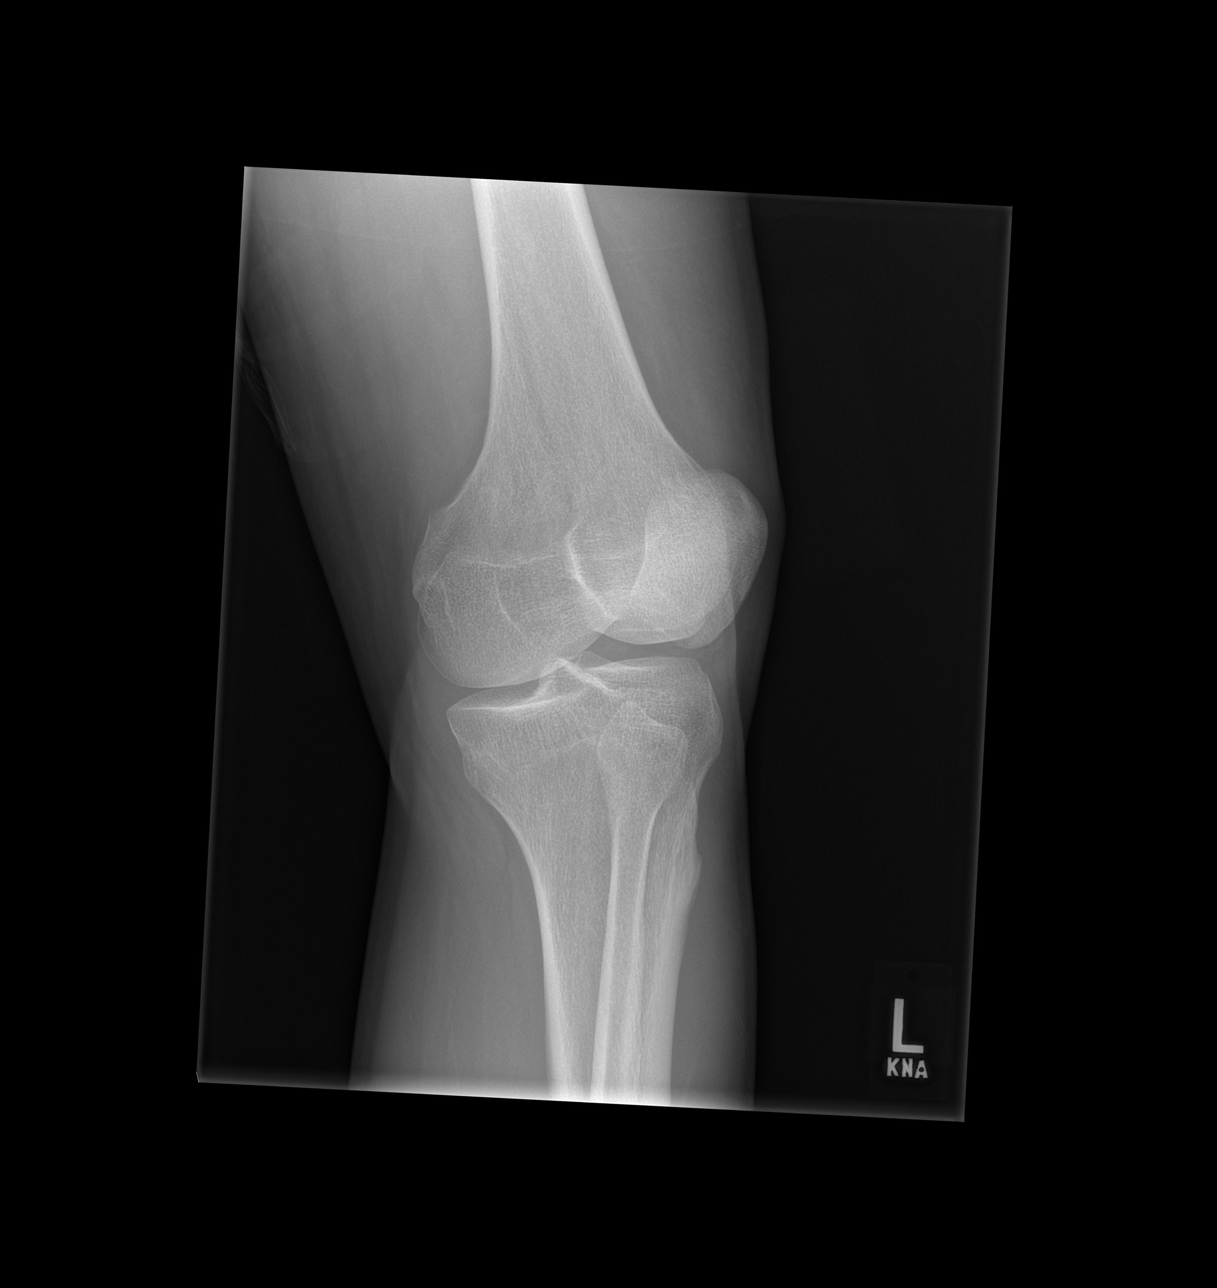

[t knee lat left]
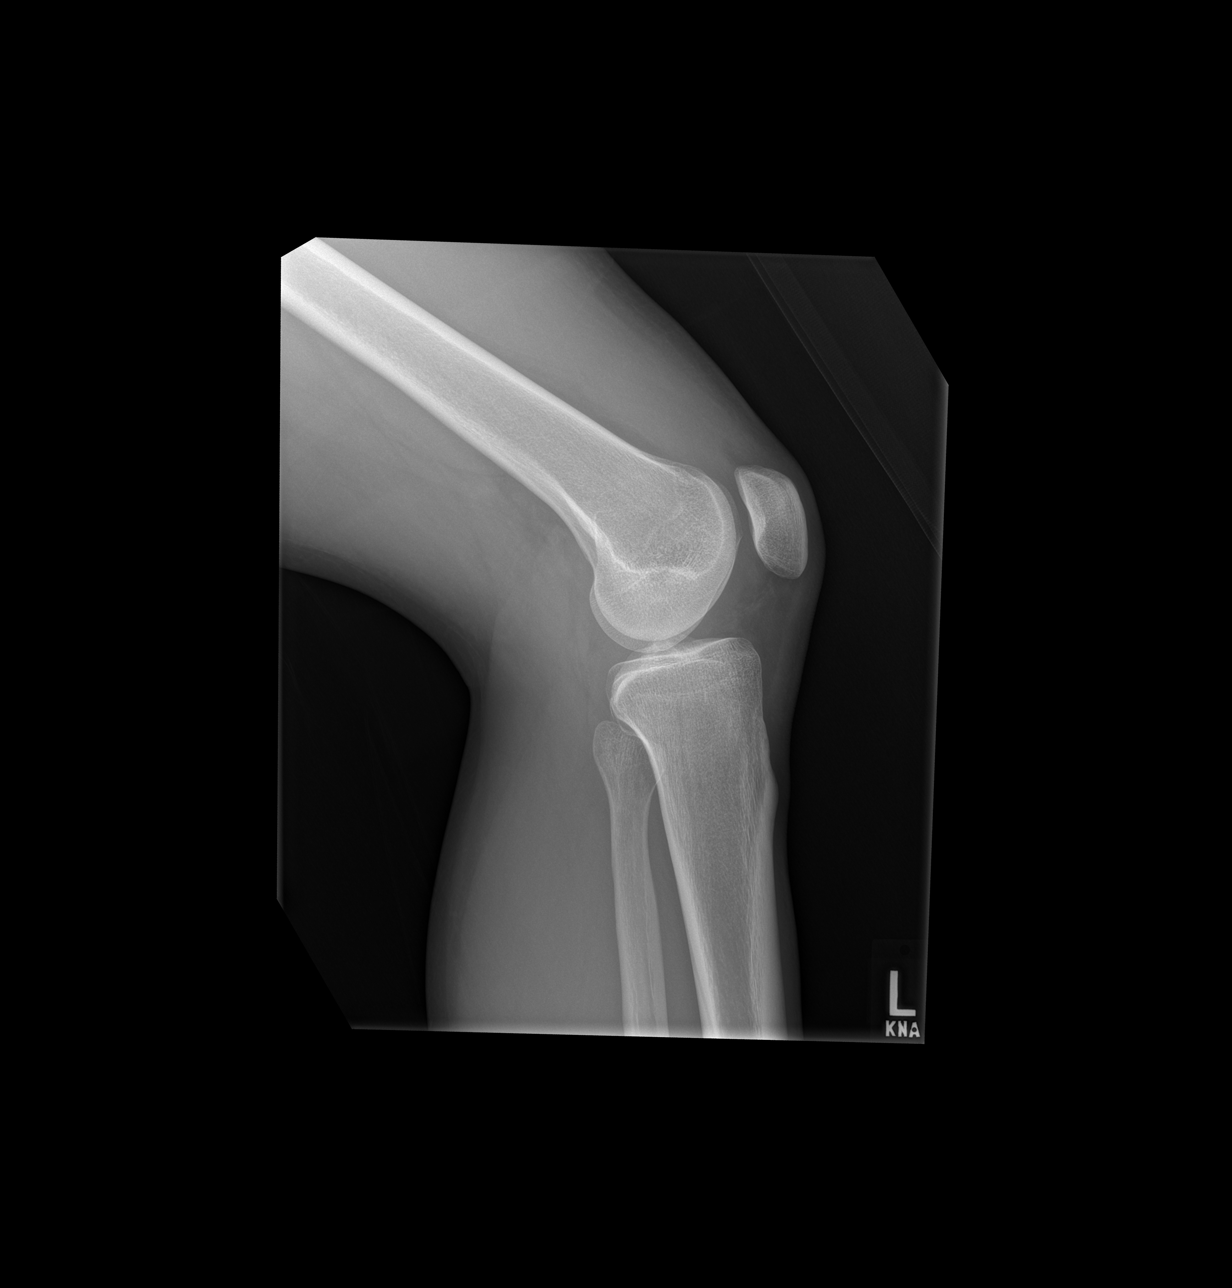

[4 of 4 positions shown; findings below may reference images not displayed]

FINDINGS: Moderate joint effusion. No acute bony abnormality. Specifically, no
fracture, subluxation, or dislocation. Soft tissues are intact.
IMPRESSION: Moderate joint effusion.  No acute bony abnormality.

## 2020-08-04 ENCOUNTER — Encounter (HOSPITAL_COMMUNITY): Payer: Self-pay

## 2020-08-04 ENCOUNTER — Other Ambulatory Visit: Payer: Self-pay

## 2020-08-04 ENCOUNTER — Ambulatory Visit (HOSPITAL_COMMUNITY)
Admission: EM | Admit: 2020-08-04 | Discharge: 2020-08-04 | Disposition: A | Discharge status: Home / Self Care | Payer: Managed Care, Other (non HMO) | Attending: Student | Admitting: Student

## 2020-08-04 DIAGNOSIS — R110 Nausea: Secondary | ICD-10-CM | POA: Diagnosis present

## 2020-08-04 DIAGNOSIS — J069 Acute upper respiratory infection, unspecified: Secondary | ICD-10-CM

## 2020-08-04 DIAGNOSIS — Z1152 Encounter for screening for COVID-19: Secondary | ICD-10-CM

## 2020-08-04 DIAGNOSIS — R059 Cough, unspecified: Secondary | ICD-10-CM | POA: Diagnosis not present

## 2020-08-04 DIAGNOSIS — R52 Pain, unspecified: Secondary | ICD-10-CM

## 2020-08-04 DIAGNOSIS — R6883 Chills (without fever): Secondary | ICD-10-CM

## 2020-08-04 LAB — POC INFLUENZA A AND B ANTIGEN (URGENT CARE ONLY)
Influenza A Ag: NEGATIVE
Influenza B Ag: NEGATIVE

## 2020-08-04 LAB — SARS CORONAVIRUS 2 (TAT 6-24 HRS): SARS Coronavirus 2: NEGATIVE

## 2020-08-04 MED ORDER — BENZONATATE 100 MG PO CAPS: 100.0000 mg | ORAL_CAPSULE | Freq: Three times a day (TID) | ORAL | 0 refills | Status: AC

## 2020-08-04 MED ORDER — ONDANSETRON HCL 8 MG PO TABS: 8.0000 mg | ORAL_TABLET | Freq: Three times a day (TID) | ORAL | 0 refills | Status: AC | PRN

## 2020-08-04 MED ORDER — PROMETHAZINE-DM 6.25-15 MG/5ML PO SYRP: 5.0000 mL | ORAL_SOLUTION | Freq: Four times a day (QID) | ORAL | 0 refills | Status: AC | PRN

## 2020-08-04 NOTE — Discharge Instructions (Addendum)
-  Tessalon (benzonatate) as needed for cough. Take one pill up to 3x daily (every 8 hours) -Promethazine DM cough syrup for congestion/cough. This could make you drowsy, so take at night before bed. -You can take Zofran (ondansetron) for nausea and vomiting as needed.  Take this up to 3 times daily. -You can also take Tylenol and ibuprofen for body aches, chills, fevers, headaches.  You can take Tylenol up to 1000 mg 3 times daily, and ibuprofen up to 800 mg 3 times daily with food.  You can take both of these together or alternate them. We are currently awaiting result of your PCR covid-19 test. This typically comes back in 1-2 days. We'll call you if the result is positive. Otherwise, the result will be sent electronically to your MyChart. You can also call this clinic and ask for your result via telephone.   Please isolate at home while awaiting these results. If your test is positive for Covid-19, continue to isolate at home for 5 days if you have mild symptoms, or a total of 10 days from symptom onset if you have more severe symptoms. If you quarantine for a shorter period of time (i.e. 5 days), make sure to wear a mask until day 10 of symptoms. Treat your symptoms at home with OTC remedies like tylenol/ibuprofen, mucinex, nyquil, etc. Seek medical attention if you develop high fevers, chest pain, shortness of breath, ear pain, facial pain, etc. Make sure to get up and move around every 2-3 hours while convalescing to help prevent blood clots. Drink plenty of fluids, and rest as much as possible.

## 2020-08-04 NOTE — ED Provider Notes (Signed)
[facility]    CSN: [id number] Arrival date & time: 08/04/20  6962      History   Chief Complaint Chief Complaint  Patient presents with  . Chills  . Cough  . Generalized Body Aches    HPI Jeff Maldonado is a 35 y.o. male presenting with URI symptoms. Notes chills, cough, body aches, nasal congestion x4 days. Nausea without vomiting or diarrhea. Denies  shortness of breath, chest pain,  facial pain, teeth pain, headaches, loss of taste/smell, swollen lymph nodes, ear pain.    HPI  History reviewed. No pertinent past medical history.  There are no problems to display for this patient.   History reviewed. No pertinent surgical history.     Home Medications    Prior to Admission medications   Medication Sig Start Date End Date Taking? Authorizing Provider  benzonatate (TESSALON) 100 MG capsule Take 1 capsule (100 mg total) by mouth every 8 (eight) hours. 08/04/20  Yes Rhys Martini, PA-C  ondansetron (ZOFRAN) 8 MG tablet Take 1 tablet (8 mg total) by mouth every 8 (eight) hours as needed for nausea or vomiting. 08/04/20  Yes Rhys Martini, PA-C  promethazine-dextromethorphan (PROMETHAZINE-DM) 6.25-15 MG/5ML syrup Take 5 mLs by mouth 4 (four) times daily as needed for cough. 08/04/20  Yes Rhys Martini, PA-C    Family History Family History  Problem Relation Age of Onset  . Diabetes Maternal Aunt     Social History Social History   Tobacco Use  . Smoking status: Current Every Day Smoker    Packs/day: 1.00    Types: Cigarettes  . Smokeless tobacco: Never Used  Substance Use Topics  . Alcohol use: No  . Drug use: No     Allergies   Patient has no known allergies.   Review of Systems Review of Systems  Constitutional: Positive for chills. Negative for appetite change and fever.  HENT: Positive for congestion. Negative for ear pain, rhinorrhea, sinus pressure, sinus pain and sore throat.   Eyes: Negative for redness and visual disturbance.   Respiratory: Positive for cough. Negative for chest tightness, shortness of breath and wheezing.   Cardiovascular: Negative for chest pain and palpitations.  Gastrointestinal: Positive for diarrhea and nausea. Negative for abdominal pain, constipation and vomiting.  Genitourinary: Negative for dysuria, frequency and urgency.  Musculoskeletal: Positive for myalgias.  Neurological: Negative for dizziness, weakness and headaches.  Psychiatric/Behavioral: Negative for confusion.  All other systems reviewed and are negative.    Physical Exam Triage Vital Signs ED Triage Vitals  Enc Vitals Group     BP 08/04/20 1014 115/86     Pulse Rate 08/04/20 1014 66     Resp 08/04/20 1014 18     Temp 08/04/20 1014 98.8 F (37.1 C)     Temp Source 08/04/20 1014 Oral     SpO2 08/04/20 1014 97 %     Weight --      Height --      Head Circumference --      Peak Flow --      Pain Score 08/04/20 1013 0     Pain Loc --      Pain Edu? --      Excl. in GC? --    No data found.  Updated Vital Signs BP 115/86 (BP Location: Left Arm)   Pulse 66   Temp 98.8 F (37.1 C) (Oral)   Resp 18   SpO2 97%   Visual Acuity Right Eye  Distance:   Left Eye Distance:   Bilateral Distance:    Right Eye Near:   Left Eye Near:    Bilateral Near:     Physical Exam Vitals reviewed.  Constitutional:      General: He is not in acute distress.    Appearance: Normal appearance. He is ill-appearing.  HENT:     Head: Normocephalic and atraumatic.     Right Ear: Hearing, tympanic membrane, ear canal and external ear normal. No swelling or tenderness. There is no impacted cerumen. No mastoid tenderness. Tympanic membrane is not perforated, erythematous, retracted or bulging.     Left Ear: Hearing, tympanic membrane, ear canal and external ear normal. No swelling or tenderness. There is no impacted cerumen. No mastoid tenderness. Tympanic membrane is not perforated, erythematous, retracted or bulging.     Nose:      Right Sinus: No maxillary sinus tenderness or frontal sinus tenderness.     Left Sinus: No maxillary sinus tenderness or frontal sinus tenderness.     Mouth/Throat:     Mouth: Mucous membranes are moist.     Pharynx: Uvula midline. No oropharyngeal exudate or posterior oropharyngeal erythema.     Tonsils: No tonsillar exudate.  Cardiovascular:     Rate and Rhythm: Normal rate and regular rhythm.     Heart sounds: Normal heart sounds.  Pulmonary:     Breath sounds: Normal breath sounds and air entry. No wheezing, rhonchi or rales.  Chest:     Chest wall: No tenderness.  Abdominal:     General: Abdomen is flat. Bowel sounds are normal.     Tenderness: There is no abdominal tenderness. There is no guarding or rebound.  Lymphadenopathy:     Cervical: No cervical adenopathy.  Neurological:     General: No focal deficit present.     Mental Status: He is alert and oriented to person, place, and time.  Psychiatric:        Attention and Perception: Attention and perception normal.        Mood and Affect: Mood and affect normal.        Behavior: Behavior normal. Behavior is cooperative.        Thought Content: Thought content normal.        Judgment: Judgment normal.      UC Treatments / Results  Labs (all labs ordered are listed, but only abnormal results are displayed) Labs Reviewed  SARS CORONAVIRUS 2 (TAT 6-24 HRS)  POC INFLUENZA A AND B ANTIGEN (URGENT CARE ONLY)    EKG   Radiology No results found.  Procedures Procedures (including critical care time)  Medications Ordered in UC Medications - No data to display  Initial Impression / Assessment and Plan / UC Course  I have reviewed the triage vital signs and the nursing notes.  Pertinent labs & imaging results that were available during my care of the patient were reviewed by me and considered in my medical decision making (see chart for details).     This patient is a 35 year old male presenting with URI  symptoms.  Zofran, tessalon, promethazine for symptomatic relief as below. Tylenol/ibuprofen. rec good hydration, BRAT diet. Rapid influezena negative.  Covid sent. Isolation as per CDC guidelines. Return precautions discussed.  This chart was dictated using voice recognition software, Dragon. Despite the best efforts of this provider to proofread and correct errors, errors may still occur which can change documentation meaning.   Final Clinical Impressions(s) / UC Diagnoses   Final  diagnoses:  Viral URI with cough  Nausea without vomiting  Encounter for screening for COVID-19     Discharge Instructions     -Tessalon (benzonatate) as needed for cough. Take one pill up to 3x daily (every 8 hours) -Promethazine DM cough syrup for congestion/cough. This could make you drowsy, so take at night before bed. -You can take Zofran (ondansetron) for nausea and vomiting as needed.  Take this up to 3 times daily. -You can also take Tylenol and ibuprofen for body aches, chills, fevers, headaches.  You can take Tylenol up to 1000 mg 3 times daily, and ibuprofen up to 800 mg 3 times daily with food.  You can take both of these together or alternate them. We are currently awaiting result of your PCR covid-19 test. This typically comes back in 1-2 days. We'll call you if the result is positive. Otherwise, the result will be sent electronically to your MyChart. You can also call this clinic and ask for your result via telephone.   Please isolate at home while awaiting these results. If your test is positive for Covid-19, continue to isolate at home for 5 days if you have mild symptoms, or a total of 10 days from symptom onset if you have more severe symptoms. If you quarantine for a shorter period of time (i.e. 5 days), make sure to wear a mask until day 10 of symptoms. Treat your symptoms at home with OTC remedies like tylenol/ibuprofen, mucinex, nyquil, etc. Seek medical attention if you develop high  fevers, chest pain, shortness of breath, ear pain, facial pain, etc. Make sure to get up and move around every 2-3 hours while convalescing to help prevent blood clots. Drink plenty of fluids, and rest as much as possible.     ED Prescriptions    Medication Sig Dispense Auth. Provider   ondansetron (ZOFRAN) 8 MG tablet Take 1 tablet (8 mg total) by mouth every 8 (eight) hours as needed for nausea or vomiting. 20 tablet Rhys Martini, PA-C   benzonatate (TESSALON) 100 MG capsule Take 1 capsule (100 mg total) by mouth every 8 (eight) hours. 21 capsule Rhys Martini, PA-C   promethazine-dextromethorphan (PROMETHAZINE-DM) 6.25-15 MG/5ML syrup Take 5 mLs by mouth 4 (four) times daily as needed for cough. 118 mL Rhys Martini, PA-C     PDMP not reviewed this encounter.   Rhys Martini, PA-C 08/04/20 1211

## 2020-08-04 NOTE — ED Triage Notes (Addendum)
Pt presents with cough, body aches, chills and nasal congestion x 4 days.
